# Patient Record
Sex: Female | Born: 1970 | Hispanic: Yes | Marital: Married
Health system: Southern US, Community
[De-identification: ages and names within clinical notes are randomized; demographics above are authoritative.]

## PROBLEM LIST (undated history)

## (undated) HISTORY — PX: LUNG SURGERY: SHX703

---

## 2004-09-26 ENCOUNTER — Emergency Department: Payer: Self-pay | Admitting: Emergency Medicine

## 2004-09-29 ENCOUNTER — Ambulatory Visit: Payer: Self-pay | Admitting: Emergency Medicine

## 2009-11-28 ENCOUNTER — Ambulatory Visit: Payer: Self-pay | Admitting: Family Medicine

## 2014-09-18 ENCOUNTER — Emergency Department
Admission: EM | Admit: 2014-09-18 | Discharge: 2014-09-18 | Disposition: A | Discharge status: Home / Self Care | Payer: Self-pay | Attending: Student | Admitting: Student

## 2014-09-18 ENCOUNTER — Other Ambulatory Visit: Payer: Self-pay

## 2014-09-18 ENCOUNTER — Emergency Department: Payer: Self-pay

## 2014-09-18 DIAGNOSIS — R002 Palpitations: Secondary | ICD-10-CM | POA: Insufficient documentation

## 2014-09-18 DIAGNOSIS — Z3202 Encounter for pregnancy test, result negative: Secondary | ICD-10-CM | POA: Insufficient documentation

## 2014-09-18 DIAGNOSIS — R079 Chest pain, unspecified: Secondary | ICD-10-CM | POA: Insufficient documentation

## 2014-09-18 DIAGNOSIS — R0602 Shortness of breath: Secondary | ICD-10-CM | POA: Insufficient documentation

## 2014-09-18 LAB — CBC WITH DIFFERENTIAL/PLATELET
BASOS PCT: 0 %
Basophils Absolute: 0 10*3/uL (ref 0–0.1)
Eosinophils Absolute: 0.1 10*3/uL (ref 0–0.7)
Eosinophils Relative: 1 %
HEMATOCRIT: 36.6 % (ref 35.0–47.0)
Hemoglobin: 12.4 g/dL (ref 12.0–16.0)
LYMPHS ABS: 1.9 10*3/uL (ref 1.0–3.6)
LYMPHS PCT: 22 %
MCH: 29.9 pg (ref 26.0–34.0)
MCHC: 33.8 g/dL (ref 32.0–36.0)
MCV: 88.6 fL (ref 80.0–100.0)
Monocytes Absolute: 0.5 10*3/uL (ref 0.2–0.9)
Monocytes Relative: 7 %
Neutro Abs: 5.8 10*3/uL (ref 1.4–6.5)
Neutrophils Relative %: 70 %
Platelets: 240 10*3/uL (ref 150–440)
RBC: 4.13 MIL/uL (ref 3.80–5.20)
RDW: 14.6 % — ABNORMAL HIGH (ref 11.5–14.5)
WBC: 8.4 10*3/uL (ref 3.6–11.0)

## 2014-09-18 LAB — COMPREHENSIVE METABOLIC PANEL
ALT: 15 U/L (ref 14–54)
ANION GAP: 5 (ref 5–15)
AST: 17 U/L (ref 15–41)
Albumin: 4 g/dL (ref 3.5–5.0)
Alkaline Phosphatase: 62 U/L (ref 38–126)
BILIRUBIN TOTAL: 0.4 mg/dL (ref 0.3–1.2)
BUN: 13 mg/dL (ref 6–20)
CO2: 27 mmol/L (ref 22–32)
CREATININE: 0.67 mg/dL (ref 0.44–1.00)
Calcium: 8.5 mg/dL — ABNORMAL LOW (ref 8.9–10.3)
Chloride: 103 mmol/L (ref 101–111)
GFR calc Af Amer: >60 mL/min (ref >60)
GFR calc non Af Amer: >60 mL/min (ref >60)
GLUCOSE: 112 mg/dL — AB (ref 65–99)
Potassium: 3.6 mmol/L (ref 3.5–5.1)
Sodium: 135 mmol/L (ref 135–145)
Total Protein: 7.1 g/dL (ref 6.5–8.1)

## 2014-09-18 LAB — TROPONIN I
Troponin I: <0.03 ng/mL (ref <0.031)
Troponin I: <0.03 ng/mL (ref <0.031)

## 2014-09-18 LAB — FIBRIN DERIVATIVES D-DIMER (ARMC ONLY): Fibrin derivatives D-dimer (ARMC): 420 (ref 0–499)

## 2014-09-18 LAB — T4, FREE: Free T4: 0.89 ng/dL (ref 0.61–1.12)

## 2014-09-18 LAB — TSH: TSH: 1.435 u[IU]/mL (ref 0.350–4.500)

## 2014-09-18 LAB — HCG, QUANTITATIVE, PREGNANCY: HCG, BETA CHAIN, QUANT, S: 1 m[IU]/mL (ref <5)

## 2014-09-18 NOTE — ED Notes (Signed)
Pt here from home with c/o chest pain to left side of chest, denies radiation. Pt reports pain 7/10, no hx of the same.

## 2014-09-18 NOTE — ED Provider Notes (Signed)
[facility] Emergency Department Provider Note  ____________________________________________  Time seen: Approximately 1:39 PM  I have reviewed the triage vital signs and the nursing notes.   HISTORY  Chief Complaint Chest Pain  Caveat - HPI and ROS limited by Spanish language barrier which was overcome with use of Spanish interpreter  HPI Jaime Chang is a 44 y.o. female with history of depression and gastritis who presents with gradual onset chest tightness, sob, palpitations, and sensation "that my whole body was filled with ice". This began at 12 pm while she was seated eating lunch. At this time her palpitations and cold sensation has resolved but she feels mild chest tightness. She received aspirin from EMS and they noted that she was mildly tachycardic but her vital signs were otherwise stable. Current severity of symptoms is mild. She reports the chest tightness does not radiate, not associated with any sweating or nausea or vomiting. It did not seem to be worse with exertion. Prior to today she had been in her usual state of health without illness though she reports she has been under a fair amount of stress lately. She has no family history of early coronary artery disease, no personal history of coronary artery disease, no personal or family history of PE or DVT.   No past medical history on file.  There are no active problems to display for this patient.   No past surgical history on file.  No current outpatient prescriptions on file.  Allergies Review of patient's allergies indicates not on file.  No family history on file.  Social History History  Substance Use Topics  . Smoking status: Not on file  . Smokeless tobacco: Not on file  . Alcohol Use: Not on file    Review of Systems Constitutional: No fever/chills Eyes: No visual changes. ENT: No sore throat. Cardiovascular: + chest pain. Respiratory: +shortness of  breath. Gastrointestinal: No abdominal pain.  No nausea, no vomiting.  No diarrhea.  No constipation. Genitourinary: Negative for dysuria. Musculoskeletal: Negative for back pain. Skin: Negative for rash. Neurological: Negative for headaches, focal weakness or numbness.  10-point ROS otherwise negative.  ____________________________________________   PHYSICAL EXAM:  VITAL SIGNS: ED Triage Vitals  Enc Vitals Group     BP 09/18/14 1336 107/74 mmHg     Pulse Rate 09/18/14 1336 91     Resp 09/18/14 1336 16     Temp 09/18/14 1336 98.5 F (36.9 C)     Temp Source 09/18/14 1336 Oral     SpO2 09/18/14 1336 98 %     Weight --      Height --      Head Cir --      Peak Flow --      Pain Score --      Pain Loc --      Pain Edu? --      Excl. in GC? --     Constitutional: Alert and oriented. Well appearing and in no acute distress. Eyes: Conjunctivae are normal. PERRL. EOMI. Head: Atraumatic. Nose: No congestion/rhinnorhea. Mouth/Throat: Mucous membranes are moist.  Oropharynx non-erythematous. Neck: No stridor.  Cardiovascular: Normal rate, regular rhythm. Grossly normal heart sounds.  Good peripheral circulation. Respiratory: Normal respiratory effort.  No retractions. Lungs CTAB. Gastrointestinal: Soft and nontender. No distention. No abdominal bruits. No CVA tenderness. Genitourinary: deferred Musculoskeletal: No lower extremity tenderness nor edema.  No joint effusions. Neurologic:  Normal speech and language. No gross focal neurologic deficits are appreciated. No  gait instability. Skin:  Skin is warm, dry and intact. No rash noted. Psychiatric: Mood and affect are normal. Speech and behavior are normal.  ____________________________________________   LABS (all labs ordered are listed, but only abnormal results are displayed)  Labs Reviewed  CBC WITH DIFFERENTIAL/PLATELET - Abnormal; Notable for the following:    RDW 14.6 (*)    All other components within normal  limits  COMPREHENSIVE METABOLIC PANEL - Abnormal; Notable for the following:    Glucose, Bld 112 (*)    Calcium 8.5 (*)    All other components within normal limits  HCG, QUANTITATIVE, PREGNANCY  TROPONIN I  FIBRIN DERIVATIVES D-DIMER ([facility] ONLY)  TSH  T4, FREE  TROPONIN I   ____________________________________________  EKG  ED ECG REPORT I, Gayla Doss, the attending physician, personally viewed and interpreted this ECG.   Date: 09/18/2014  EKG Time: 13:52  Rate: 96  Rhythm: normal sinus rhythm  Axis: normal  Intervals:none  ST&T Change: No acute ST segment elevation. No Q waves.  ____________________________________________  RADIOLOGY  CXR  IMPRESSION: Probable scarring the right lung base, given the history of prior surgery. No superimposed acute abnormality.    ____________________________________________   PROCEDURES  Procedure(s) performed: None  Critical Care performed: No  ____________________________________________   INITIAL IMPRESSION / ASSESSMENT AND PLAN / ED COURSE  Pertinent labs & imaging results that were available during my care of the patient were reviewed by me and considered in my medical decision making (see chart for details).  Jaime Chang is a 44 y.o. female with history of  Depression and gastritis who presents with gradual onset chest tightness, sob, palpitations, and sensation "that my whole body was filled with ice". On exam, she is very well-appearing and in no acute distress. Vital signs stable, she is afebrile. She has a benign examination. EKG reassuring. Suspect anxiety-related chest pain. Troponin negative and obtained 3 hours after onset of pain. Heart score 0. Doubt ACS. D-dimer not elevated, doubt PE. CXR clear. No arrhythmia since she has been observed in the emergency department despite her sensation that her heart is racing. Discussed possibility of anxiety related chest pain with the assistance of Spanish  interpreter, need for close PCP follow-up, return precautions and the patient is comfortable with the discharge plan. ____________________________________________   FINAL CLINICAL IMPRESSION(S) / ED DIAGNOSES  Final diagnoses:  Chest pain, unspecified chest pain type  Palpitation      Gayla Doss, MD 09/18/14 1818

## 2020-01-21 ENCOUNTER — Emergency Department: Payer: Self-pay

## 2020-01-21 ENCOUNTER — Encounter: Payer: Self-pay | Admitting: Emergency Medicine

## 2020-01-21 ENCOUNTER — Other Ambulatory Visit: Payer: Self-pay

## 2020-01-21 ENCOUNTER — Emergency Department
Admission: EM | Admit: 2020-01-21 | Discharge: 2020-01-21 | Disposition: A | Discharge status: Home / Self Care | Payer: Self-pay | Attending: Emergency Medicine | Admitting: Emergency Medicine

## 2020-01-21 DIAGNOSIS — J45909 Unspecified asthma, uncomplicated: Secondary | ICD-10-CM | POA: Insufficient documentation

## 2020-01-21 DIAGNOSIS — R079 Chest pain, unspecified: Secondary | ICD-10-CM | POA: Insufficient documentation

## 2020-01-21 LAB — CBC
HCT: 35.7 % — ABNORMAL LOW (ref 36.0–46.0)
Hemoglobin: 12.4 g/dL (ref 12.0–15.0)
MCH: 31.6 pg (ref 26.0–34.0)
MCHC: 34.7 g/dL (ref 30.0–36.0)
MCV: 90.8 fL (ref 80.0–100.0)
Platelets: 236 10*3/uL (ref 150–400)
RBC: 3.93 MIL/uL (ref 3.87–5.11)
RDW: 14.1 % (ref 11.5–15.5)
WBC: 8.7 10*3/uL (ref 4.0–10.5)
nRBC: 0 % (ref 0.0–0.2)

## 2020-01-21 LAB — BASIC METABOLIC PANEL
Anion gap: 5 (ref 5–15)
BUN: 11 mg/dL (ref 6–20)
CO2: 27 mmol/L (ref 22–32)
Calcium: 8.7 mg/dL — ABNORMAL LOW (ref 8.9–10.3)
Chloride: 105 mmol/L (ref 98–111)
Creatinine, Ser: 0.6 mg/dL (ref 0.44–1.00)
GFR, Estimated: >60 mL/min (ref >60)
Glucose, Bld: 124 mg/dL — ABNORMAL HIGH (ref 70–99)
Potassium: 3.6 mmol/L (ref 3.5–5.1)
Sodium: 137 mmol/L (ref 135–145)

## 2020-01-21 LAB — TROPONIN I (HIGH SENSITIVITY)
Troponin I (High Sensitivity): 4 ng/L (ref <18)
Troponin I (High Sensitivity): 4 ng/L (ref <18)

## 2020-01-21 NOTE — Discharge Instructions (Addendum)
Please seek medical attention for any high fevers, chest pain, shortness of breath, change in behavior, persistent vomiting, bloody stool or any other new or concerning symptoms.  

## 2020-01-21 NOTE — ED Notes (Signed)
Pt denies chest pain at this time.

## 2020-01-21 NOTE — ED Triage Notes (Signed)
Pt to ED via POV c/o Left sided chest pain that started this morning. Pt states that she was starting to sweep her floor when the pain started, pt states that it felt like a throbbing and pulsing in her chest, like needles. Pt states that she is not having the pain at this time. Pt also states that she was having shortness of breath this morning when she had the pain as well. Pt is in NAD and able to speak in complete sentences at this time.

## 2020-01-21 NOTE — ED Notes (Signed)
Patient given update on wait time. Patient verbalizes understanding.  

## 2020-01-21 NOTE — ED Provider Notes (Signed)
[facility] Emergency Department Provider Note   ____________________________________________   I have reviewed the triage vital signs and the nursing notes.   HISTORY  Chief Complaint Chest Pain   History limited by: [facility] Interpreter utilized   HPI Jaime Chang is a 49 y.o. female who presents to the emergency department today because of concerns for chest pain.  The patient states that the chest pain occurred this morning while she was sweeping.  Is located in her left chest.  She describes it as not truly pain but the sensation of her heart pounding.  She is also aware of her pulse pounding in her wrist and it was going fast.  Patient denies any shortness of breath with this.  She denies any diaphoresis.  She states the whole episode lasts about 15 min.  Since then she has felt fine.  The patient states that she has had chest pain in the past that was thought to be secondary to anxiety.   Records reviewed. Per medical record review patient has a history of anxiety, dyspepsia, asthma.   History reviewed. No pertinent past medical history.  There are no problems to display for this patient.   Past Surgical History:  Procedure Laterality Date  . LUNG SURGERY Right     Prior to Admission medications   Not on File    Allergies Patient has no known allergies.  No family history on file.  Social History Social History   Tobacco Use  . Smoking status: Not on file  Substance Use Topics  . Alcohol use: Not on file  . Drug use: Not on file    Review of Systems Constitutional: No fever/chills Eyes: No visual changes. ENT: No sore throat. Cardiovascular: Positive for palpitations. Respiratory: Denies shortness of breath. Gastrointestinal: No abdominal pain.  No nausea, no vomiting.  No diarrhea.   Genitourinary: Negative for dysuria. Musculoskeletal: Negative for back pain. Skin: Negative for rash. Neurological:  Negative for headaches, focal weakness or numbness.  ____________________________________________   PHYSICAL EXAM:  VITAL SIGNS: ED Triage Vitals  Enc Vitals Group     BP 01/21/20 1507 100/68     Pulse Rate 01/21/20 1507 94     Resp 01/21/20 1507 16     Temp 01/21/20 1507 99.2 F (37.3 C)     Temp Source 01/21/20 1507 Oral     SpO2 01/21/20 1507 99 %     Weight 01/21/20 1507 138 lb (62.6 kg)     Height 01/21/20 1507 5\' 3"  (1.6 m)     Head Circumference --      Peak Flow --      Pain Score 01/21/20 1519 0   Constitutional: Alert and oriented.  Eyes: Conjunctivae are normal.  ENT      Head: Normocephalic and atraumatic.      Nose: No congestion/rhinnorhea.      Mouth/Throat: Mucous membranes are moist.      Neck: No stridor. Hematological/Lymphatic/Immunilogical: No cervical lymphadenopathy. Cardiovascular: Normal rate, regular rhythm.  No murmurs, rubs, or gallops.  Respiratory: Normal respiratory effort without tachypnea nor retractions. Breath sounds are clear and equal bilaterally. No wheezes/rales/rhonchi. Gastrointestinal: Soft and non tender. No rebound. No guarding.  Genitourinary: Deferred Musculoskeletal: Normal range of motion in all extremities. No lower extremity edema. Neurologic:  Normal speech and language. No gross focal neurologic deficits are appreciated.  Skin:  Skin is warm, dry and intact. No rash noted. Psychiatric: Mood and affect are normal. Speech  and behavior are normal. Patient exhibits appropriate insight and judgment.  ____________________________________________    LABS (pertinent positives/negatives)  Trop hs 4 x 2 CBC wbc 8.7, hgb 12.4, plt 236 BMP na 137, k 3.6, glu 124, cr 0.60  ____________________________________________   EKG  I, Phineas Semen, attending physician, personally viewed and interpreted this EKG  EKG Time: 1453 Rate: 97 Rhythm: normal sinus rhythm Axis: normal Intervals: qtc 431 QRS: narrow ST changes: no  st elevation Impression: normal ekg  ____________________________________________    RADIOLOGY  CXR No acute abnormality  ____________________________________________   PROCEDURES  Procedures  ____________________________________________   INITIAL IMPRESSION / ASSESSMENT AND PLAN / ED COURSE  Pertinent labs & imaging results that were available during my care of the patient were reviewed by me and considered in my medical decision making (see chart for details).   Patient presents to the emergency department today because of concerns for an episode of chest pain that last about 15 min.  Time my exam patient states she feels fine.  Troponin negative x2.  No leukocytosis or pneumonia on chest x-ray.  No pneumothorax.  EKG without concerning arrhythmia.  This time I doubt PE or dissection.  Did discuss findings with patient.  At this time given that she feels fine I do think it is reasonable for her to be discharged home.  ____________________________________________   FINAL CLINICAL IMPRESSION(S) / ED DIAGNOSES  Final diagnoses:  Nonspecific chest pain     Note: This dictation was prepared with Dragon dictation. Any transcriptional errors that result from this process are unintentional     Phineas Semen, MD 01/21/20 2052

## 2021-05-08 IMAGING — CR DG CHEST 2V
2 series · 2 of 2 positions shown · non-contrast
Comparison: 09/18/2014

CLINICAL DATA: Left-sided chest pain and shortness of breath since
this morning

EXAM:
CHEST - 2 VIEW

[chest pa]
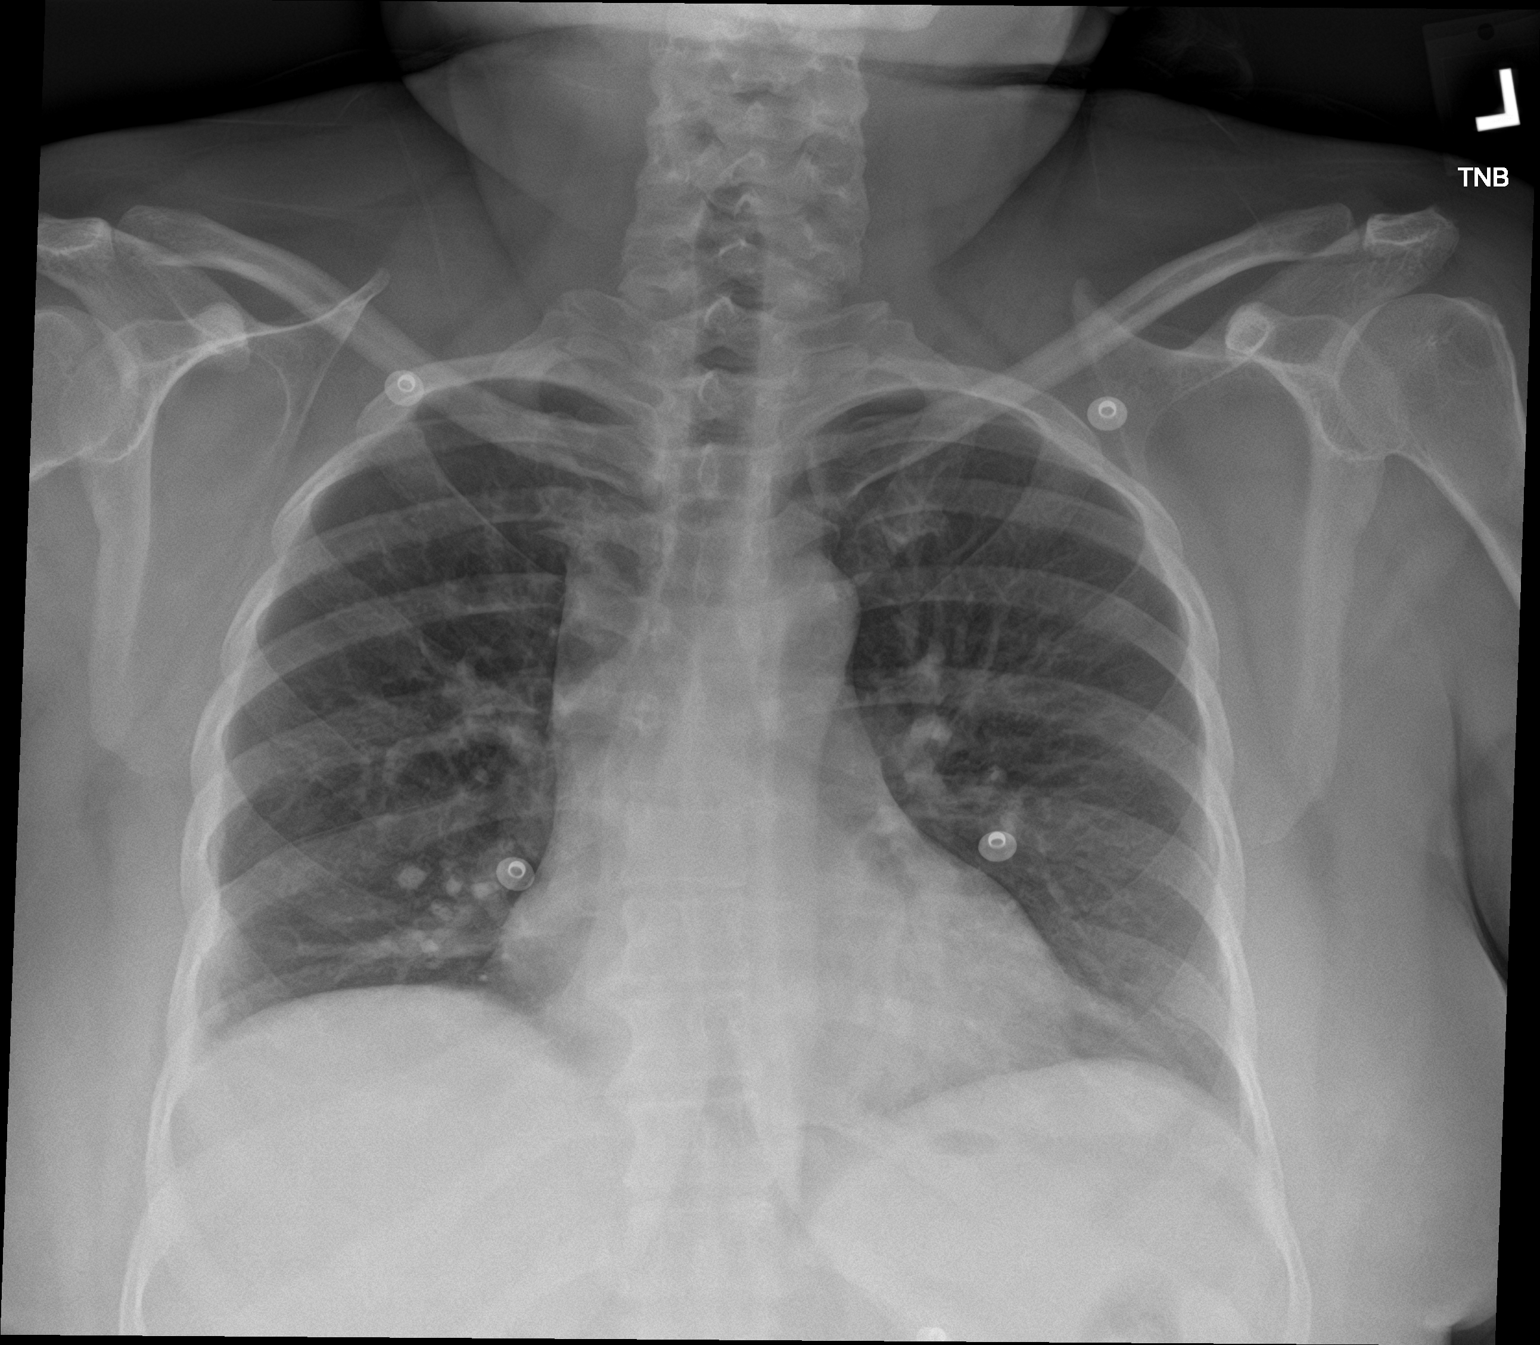

[chest lat]
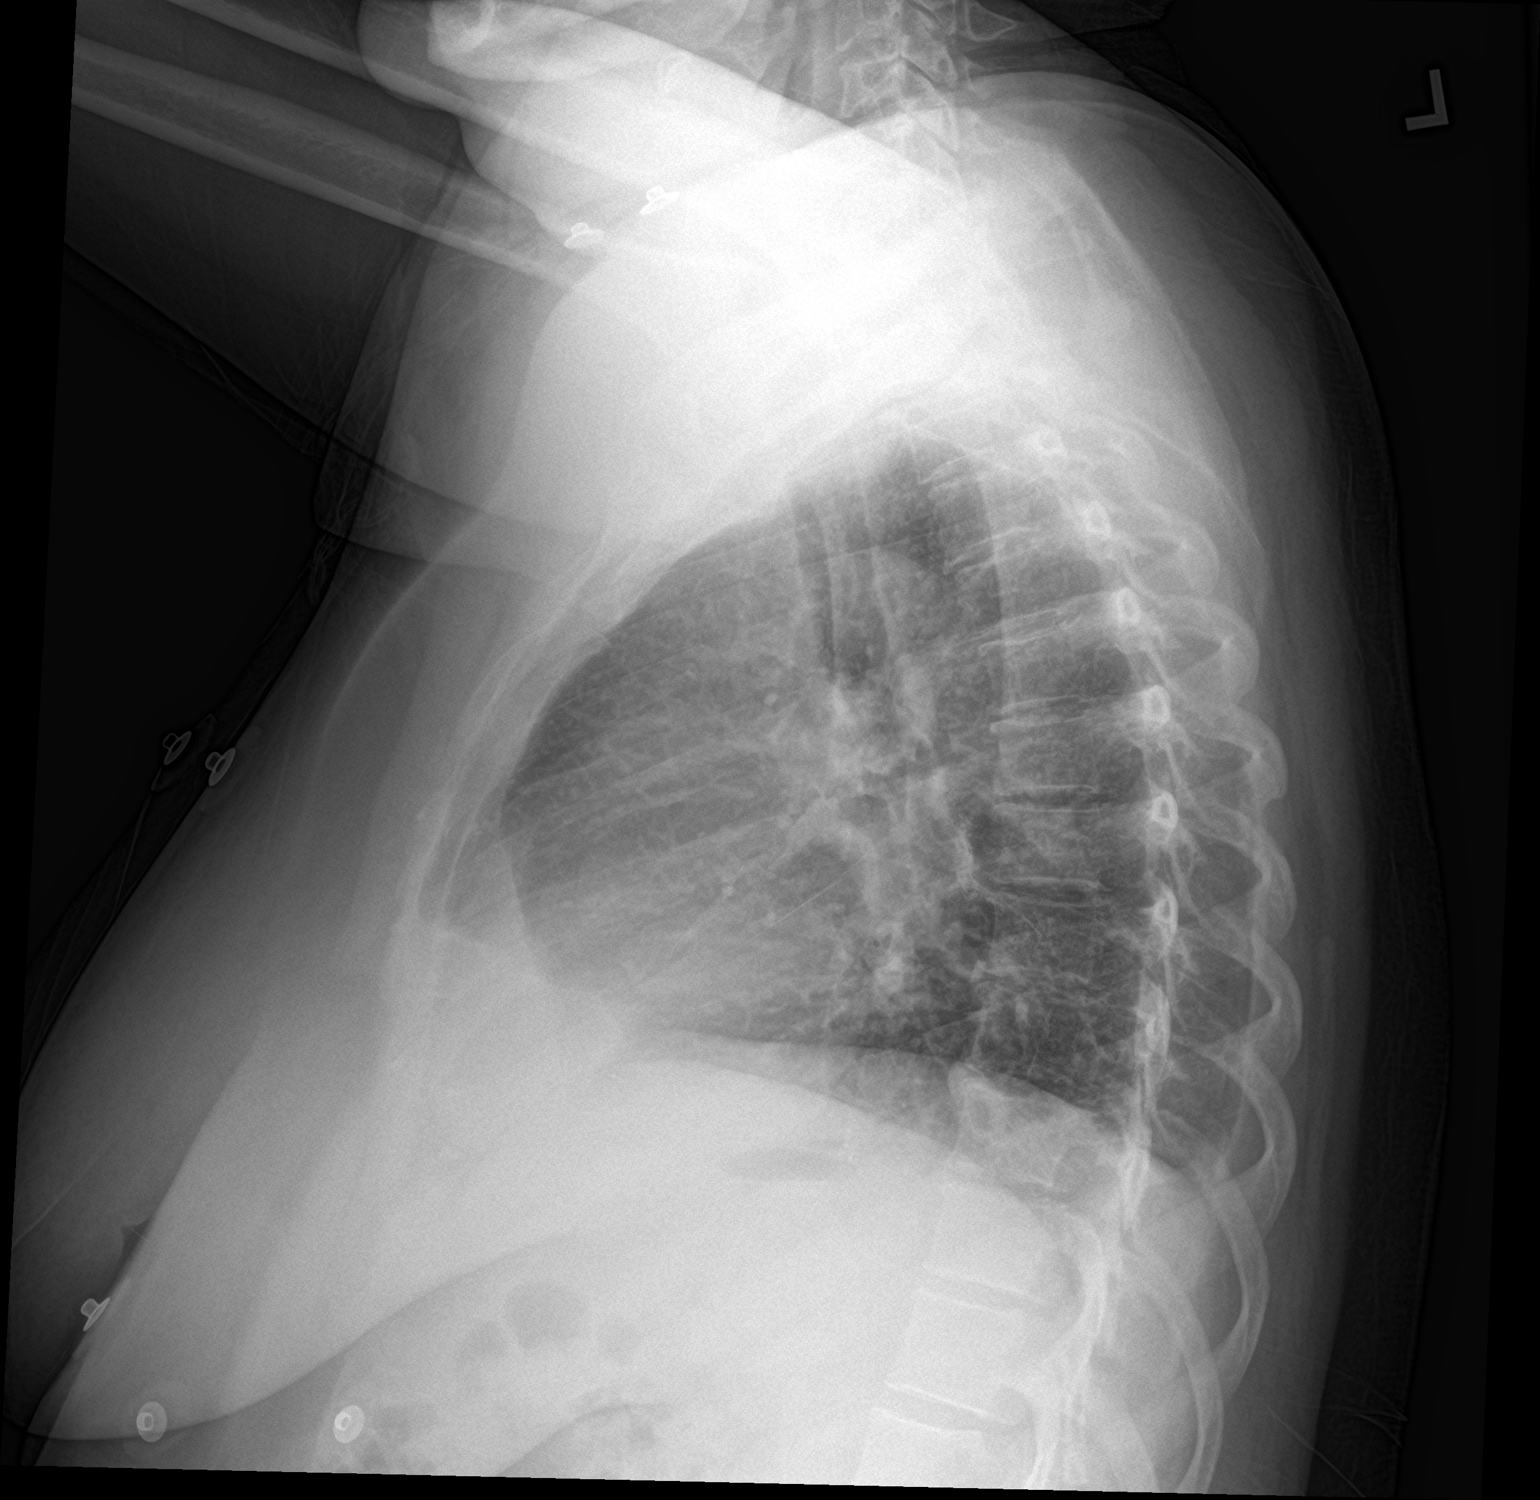

[2 of 2 positions shown; findings below may reference images not displayed]

FINDINGS: The heart size and mediastinal contours are within normal limits.
Both lungs are clear. The visualized skeletal structures are
unremarkable.
IMPRESSION: No active cardiopulmonary disease.

## 2021-06-21 ENCOUNTER — Emergency Department
Admission: EM | Admit: 2021-06-21 | Discharge: 2021-06-22 | Disposition: A | Discharge status: Other Acute Inpatient Hospital | Payer: Self-pay | Attending: Emergency Medicine | Admitting: Emergency Medicine

## 2021-06-21 ENCOUNTER — Other Ambulatory Visit: Payer: Self-pay

## 2021-06-21 ENCOUNTER — Emergency Department: Payer: Self-pay

## 2021-06-21 DIAGNOSIS — H53142 Visual discomfort, left eye: Secondary | ICD-10-CM | POA: Insufficient documentation

## 2021-06-21 DIAGNOSIS — H5712 Ocular pain, left eye: Secondary | ICD-10-CM

## 2021-06-21 DIAGNOSIS — H53149 Visual discomfort, unspecified: Secondary | ICD-10-CM

## 2021-06-21 LAB — CBC WITH DIFFERENTIAL/PLATELET
Abs Immature Granulocytes: 0.03 10*3/uL (ref 0.00–0.07)
Basophils Absolute: 0 10*3/uL (ref 0.0–0.1)
Basophils Relative: 0 %
Eosinophils Absolute: 0.1 10*3/uL (ref 0.0–0.5)
Eosinophils Relative: 2 %
HCT: 38.9 % (ref 36.0–46.0)
Hemoglobin: 12.6 g/dL (ref 12.0–15.0)
Immature Granulocytes: 0 %
Lymphocytes Relative: 21 %
Lymphs Abs: 1.6 10*3/uL (ref 0.7–4.0)
MCH: 29.5 pg (ref 26.0–34.0)
MCHC: 32.4 g/dL (ref 30.0–36.0)
MCV: 91.1 fL (ref 80.0–100.0)
Monocytes Absolute: 0.5 10*3/uL (ref 0.1–1.0)
Monocytes Relative: 7 %
Neutro Abs: 5.2 10*3/uL (ref 1.7–7.7)
Neutrophils Relative %: 70 %
Platelets: 240 10*3/uL (ref 150–400)
RBC: 4.27 MIL/uL (ref 3.87–5.11)
RDW: 13.8 % (ref 11.5–15.5)
WBC: 7.5 10*3/uL (ref 4.0–10.5)
nRBC: 0 % (ref 0.0–0.2)

## 2021-06-21 LAB — COMPREHENSIVE METABOLIC PANEL
ALT: 23 U/L (ref 0–44)
AST: 18 U/L (ref 15–41)
Albumin: 4 g/dL (ref 3.5–5.0)
Alkaline Phosphatase: 79 U/L (ref 38–126)
Anion gap: 7 (ref 5–15)
BUN: 20 mg/dL (ref 6–20)
CO2: 26 mmol/L (ref 22–32)
Calcium: 8.8 mg/dL — ABNORMAL LOW (ref 8.9–10.3)
Chloride: 108 mmol/L (ref 98–111)
Creatinine, Ser: 0.68 mg/dL (ref 0.44–1.00)
GFR, Estimated: >60 mL/min (ref >60)
Glucose, Bld: 106 mg/dL — ABNORMAL HIGH (ref 70–99)
Potassium: 4.1 mmol/L (ref 3.5–5.1)
Sodium: 141 mmol/L (ref 135–145)
Total Bilirubin: 0.6 mg/dL (ref 0.3–1.2)
Total Protein: 7.2 g/dL (ref 6.5–8.1)

## 2021-06-21 MED ORDER — IOHEXOL 300 MG/ML  SOLN
75.0000 mL | Freq: Once | INTRAMUSCULAR | Status: AC | PRN
  Administered 2021-06-21: 75 mL via INTRAVENOUS

## 2021-06-21 MED ORDER — TETRACAINE HCL 0.5 % OP SOLN
1.0000 [drp] | Freq: Once | OPHTHALMIC | Status: AC
Start: 2021-06-21 — End: 2021-06-21
  Administered 2021-06-21: 1 [drp] via OPHTHALMIC
  Filled 2021-06-21: qty 4

## 2021-06-21 MED ORDER — FLUORESCEIN SODIUM 1 MG OP STRP
1.0000 | ORAL_STRIP | Freq: Once | OPHTHALMIC | Status: AC
Start: 2021-06-21 — End: 2021-06-21
  Administered 2021-06-21: 1 via OPHTHALMIC
  Filled 2021-06-21: qty 1

## 2021-06-21 NOTE — ED Provider Notes (Signed)
? ?[facility] ?Provider Note ? ? Event Date/Time  ? First MD Initiated Contact with Patient 06/21/21 2050   ?  (approximate) ?History  ?Eye Problem and Post-op Problem ? ?HPI ?Jaime Chang is a 51 y.o. female with a recent past medical history of a pterygium surgery on 04/22/2021 at [facility] by Dr. Jhonnie Garner who presents today for worsening left eye pain.  Patient was seen 2 days ago in follow-up clinic in ophthalmology and placed on Vigamox drops for a presumed ocular infection.  Patient states that since that time she has had increasing photophobia and increasing pain with light as well as decreasing visual acuity and headache.  Patient denies any foreign body sensation but does state 10/10 burning pain to the left eye that is worsened with any eye movement or opening her eye with any light on.  Patient denies any fevers, ear pain, sore throat, or rhinorrhea ?Physical Exam  ?Triage Vital Signs: ?ED Triage Vitals  ?Enc Vitals Group  ?   BP 06/21/21 1941 128/85  ?   Pulse Rate 06/21/21 1941 95  ?   Resp 06/21/21 1941 17  ?   Temp 06/21/21 1941 98.9 ?F (37.2 ?C)  ?   Temp Source 06/21/21 1941 Oral  ?   SpO2 06/21/21 1941 98 %  ?   Weight --   ?   Height --   ?   Head Circumference --   ?   Peak Flow --   ?   Pain Score 06/21/21 1942 10  ?   Pain Loc --   ?   Pain Edu? --   ?   Excl. in GC? --   ? ?Most recent vital signs: ?Vitals:  ? 06/21/21 1941 06/21/21 2300  ?BP: 128/85 106/72  ?Pulse: 95 76  ?Resp: 17 18  ?Temp: 98.9 ?F (37.2 ?C)   ?SpO2: 98% 98%  ? ?General: Awake, oriented x4. ?CV:  Good peripheral perfusion.  ?Resp:  Normal effort.  ?Abd:  No distention.  ?Other:  Middle-aged Hispanic overweight female laying in bed in moderate distress secondary to pain.  Examination of this left eye extremely limited secondary to pain.  I was only able to examine her after tetracaine administration and in a completely dark room with only a UV light and shows significant conjunctival injection with what  looks to be a hazy anterior chamber ?ED Results / Procedures / Treatments  ?Labs ?(all labs ordered are listed, but only abnormal results are displayed) ?Labs Reviewed  ?COMPREHENSIVE METABOLIC PANEL - Abnormal; Notable for the following components:  ?    Result Value  ? Glucose, Bld 106 (*)   ? Calcium 8.8 (*)   ? All other components within normal limits  ?CBC WITH DIFFERENTIAL/PLATELET  ? ?RADIOLOGY ?ED MD interpretation: CT of the orbits with IV contrast interpreted by me and shows no evidence of acute abnormalities. ?-Agree with radiology assessment ?Official radiology report(s): ?CT ORBITS W CONTRAST ? ?Result Date: 06/21/2021 ?CLINICAL DATA:  Left eye pain EXAM: CT ORBITS WITH CONTRAST TECHNIQUE: Multidetector CT images was performed according to the standard protocol following intravenous contrast administration. RADIATION DOSE REDUCTION: This exam was performed according to the departmental dose-optimization program which includes automated exposure control, adjustment of the mA and/or kV according to patient size and/or use of iterative reconstruction technique. CONTRAST:  38mL OMNIPAQUE IOHEXOL 300 MG/ML  SOLN COMPARISON:  None Available. FINDINGS: Orbits: The globes are intact. Optic nerves are normal. Normal extraocular muscles and lacrimal  glands. The bony orbit, preseptal soft tissues and the intra- and extraconal orbital fat are normal. Visualized sinuses:  No fluid levels or advanced mucosal thickening. Soft tissues: Normal. Limited intracranial: Normal. IMPRESSION: Normal CT of the orbits. Electronically Signed   By: Deatra Robinson M.D.   On: 06/21/2021 23:18   ?PROCEDURES: ?Critical Care performed: No ?Procedures ?MEDICATIONS ORDERED IN ED: ?Medications  ?tetracaine (PONTOCAINE) 0.5 % ophthalmic solution 1 drop (1 drop Left Eye Given by Other 06/21/21 2120)  ?fluorescein ophthalmic strip 1 strip (1 strip Left Eye Given by Other 06/21/21 2120)  ?iohexol (OMNIPAQUE) 300 MG/ML solution 75 mL (75 mLs  Intravenous Contrast Given 06/21/21 2234)  ? ?IMPRESSION / MDM / ASSESSMENT AND PLAN / ED COURSE  ?I reviewed the triage vital signs and the nursing notes. ?             ?               ?Differential diagnosis includes, but is not limited to, conjunctivitis, uveitis, episcleritis, orbital cellulitis ?The patient is on the cardiac monitor to evaluate for evidence of arrhythmia and/or significant heart rate changes. ?Patient a 51 year old female with past medical history of recent pterygium surgery and diagnosis of left eye infection who presents for worsening pain in the left eye despite using Vigamox drops at home.  Patient's exam is severely limited secondary to pain in this left eye due to photophobia.  Patient does show evidence of possible haziness in her anterior chamber concerning for possible intraocular infection.  As patient had this surgery done at [facility], I have contacted the on-call ophthalmologist for possible transfer to their facility for further evaluation and management. ? ?  ?FINAL CLINICAL IMPRESSION(S) / ED DIAGNOSES  ? ?Final diagnoses:  ?Left eye pain  ?Photophobia  ? ?Rx / DC Orders  ? ?ED Discharge Orders   ? ? None  ? ?  ? ?Note:  This document was prepared using Dragon voice recognition software and may include unintentional dictation errors. ?  ?Merwyn Katos, MD ?06/21/21 2333 ? ?

## 2021-06-21 NOTE — ED Triage Notes (Signed)
Pt states she had eye Sx 1 month ago. Had 2 follow up appointments that went well. 3 days ago she developed an eye infection of the left eye per pt and was started on antibiotic eye drops. Pt states the pain is much worse today and is having sever light sensitivity. Sclera is grossly irritated but no drainage noted at this time. Unable to do visual exam due to pt having severe pain with opening their eyes at this time.   ?

## 2021-06-22 NOTE — ED Notes (Signed)
Called [facility] and cancelled any transport if arranged for am ?
